# Patient Record
Sex: Female | Born: 1985 | Race: Black or African American | Hispanic: No | Marital: Single | Smoking: Never smoker
Health system: Southern US, Community
[De-identification: ages and names within clinical notes are randomized; demographics above are authoritative.]

---

## 2008-02-03 ENCOUNTER — Ambulatory Visit: Payer: Self-pay | Admitting: Family Medicine

## 2008-02-27 ENCOUNTER — Ambulatory Visit: Payer: Self-pay | Admitting: Family Medicine

## 2009-04-08 ENCOUNTER — Ambulatory Visit: Payer: Self-pay | Admitting: Internal Medicine

## 2009-08-25 ENCOUNTER — Ambulatory Visit: Payer: Self-pay | Admitting: Internal Medicine

## 2010-01-23 ENCOUNTER — Ambulatory Visit: Payer: Self-pay | Admitting: Family Medicine

## 2010-02-13 ENCOUNTER — Ambulatory Visit: Payer: Self-pay | Admitting: Surgery

## 2010-02-21 ENCOUNTER — Ambulatory Visit: Payer: Self-pay | Admitting: Surgery

## 2010-05-22 ENCOUNTER — Ambulatory Visit: Payer: Self-pay | Admitting: Family Medicine

## 2010-05-29 ENCOUNTER — Ambulatory Visit: Payer: Self-pay | Admitting: Family Medicine

## 2010-07-12 ENCOUNTER — Ambulatory Visit: Payer: Self-pay | Admitting: Family Medicine

## 2010-08-30 NOTE — Assessment & Plan Note (Signed)
Summary: FLU SHOT/EVM  Nurse Visit   Immunizations Administered:  Influenza Vaccine:    Vaccine Type: FLULAVAL    Site: right deltoid    Mfr: GlaxoSmithKline    Dose: 0.5 ml    Route: IM    Given by: Levonne Spiller EMT-P    Exp. Date: 01/26/2011    Lot #: [id number]    VIS given: 02/20/10 version given July 12, 2010.   Immunizations Administered:  Influenza Vaccine:    Vaccine Type: FLULAVAL    Site: right deltoid    Mfr: GlaxoSmithKline    Dose: 0.5 ml    Route: IM    Given by: Levonne Spiller EMT-P    Exp. Date: 01/26/2011    Lot #: [id number]    VIS given: 02/20/10 version given July 12, 2010.  Flu Vaccine Consent Questions:    Do you have a history of severe allergic reactions to this vaccine? no    Any prior history of allergic reactions to egg and/or gelatin? no    Do you have a sensitivity to the preservative Thimersol? no    Do you have a past history of Guillan-Barre Syndrome? no    Do you currently have an acute febrile illness? no    Have you ever had a severe reaction to latex? no    Vaccine information given and explained to patient? yes    Are you currently pregnant? no

## 2010-11-16 IMAGING — US ABDOMEN ULTRASOUND
1 series · 17 of 25 positions shown · non-contrast
Comparison: none

REASON FOR EXAM: RUQ AND EPIGASTRIC PAIN
COMMENTS:

[Series 1: abdomen ultrasound · 17 of 86 slices shown]
[im 1/86]
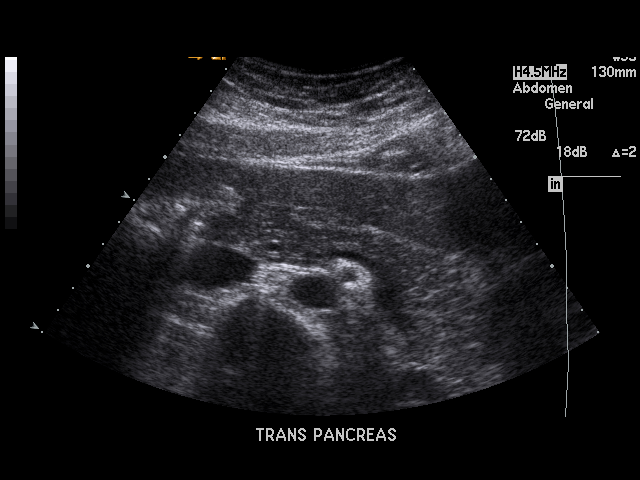
[im 8/86]
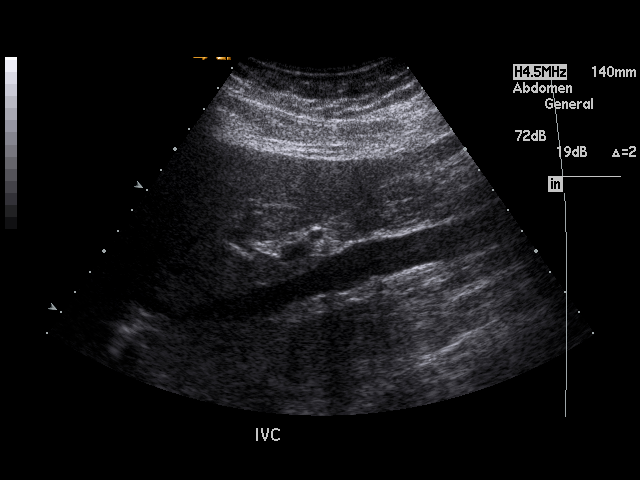
[im 11/86]
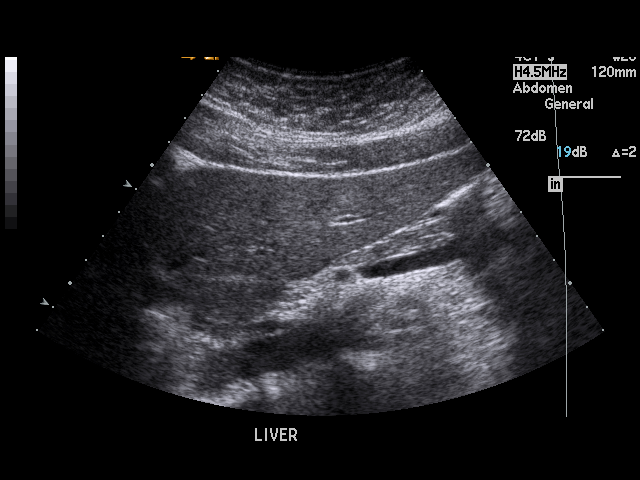
[im 18/86]
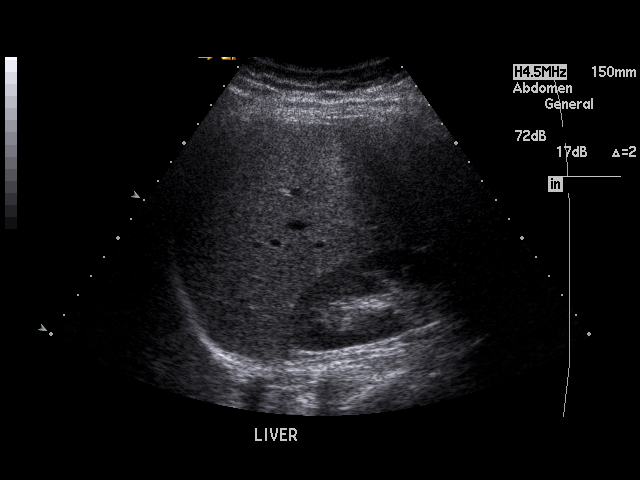
[im 22/86]
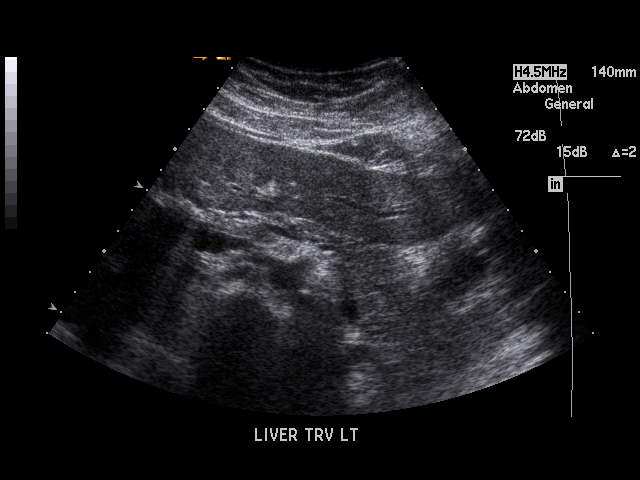
[im 29/86]
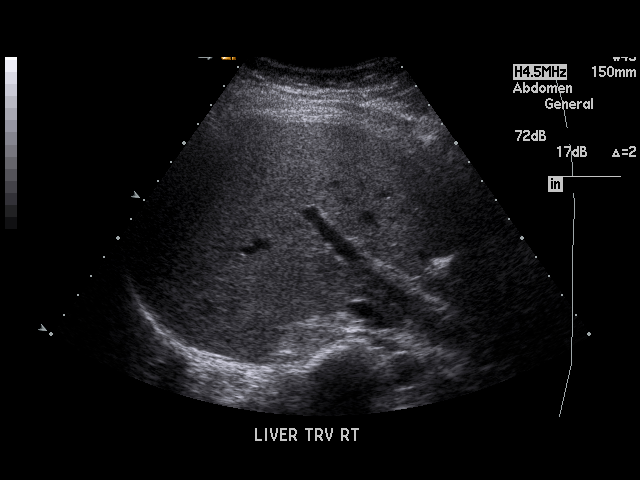
[im 32/86]
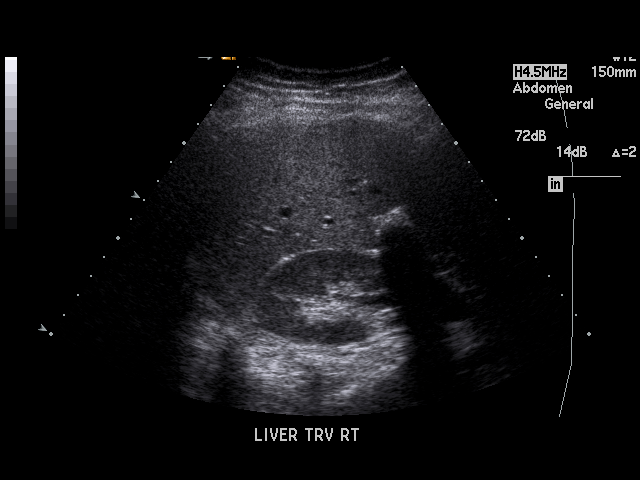
[im 39/86]
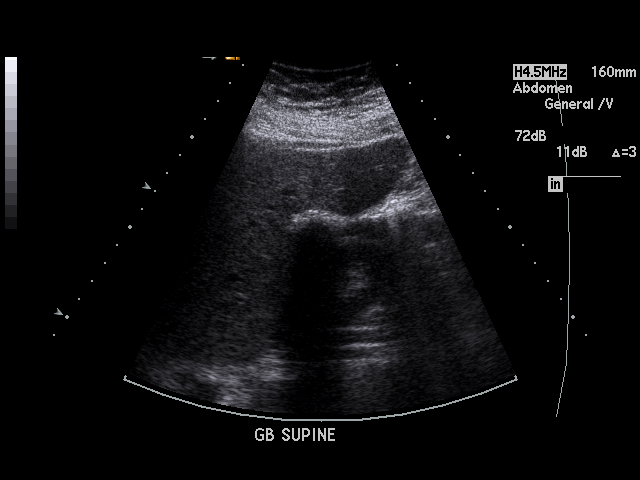
[im 43/86]
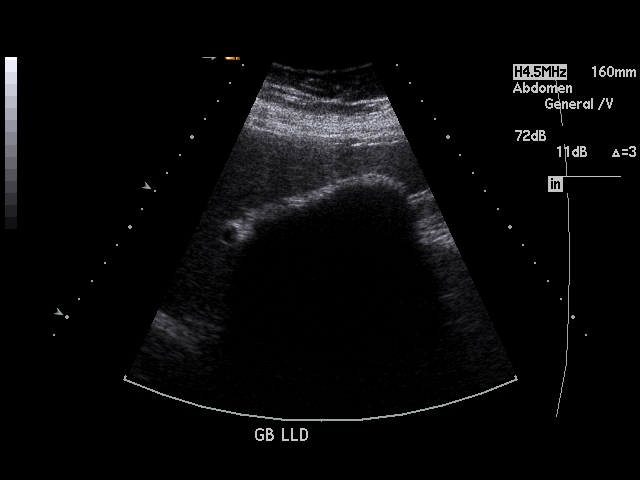
[im 47/86]
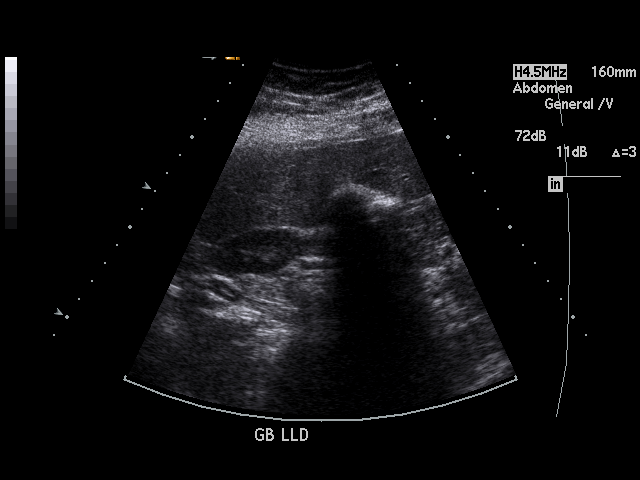
[im 54/86]
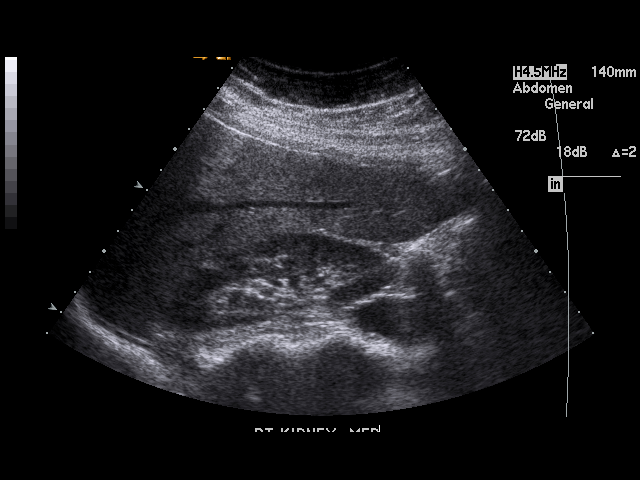
[im 57/86]
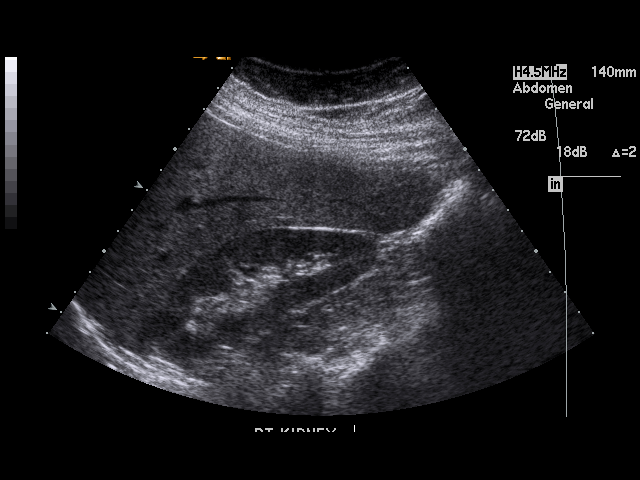
[im 64/86]
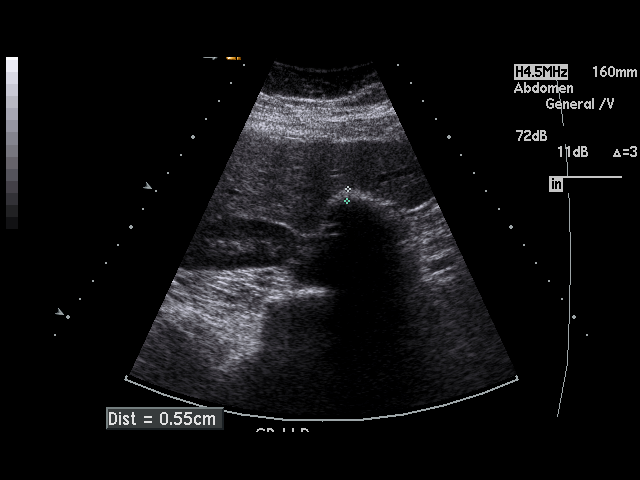
[im 68/86]
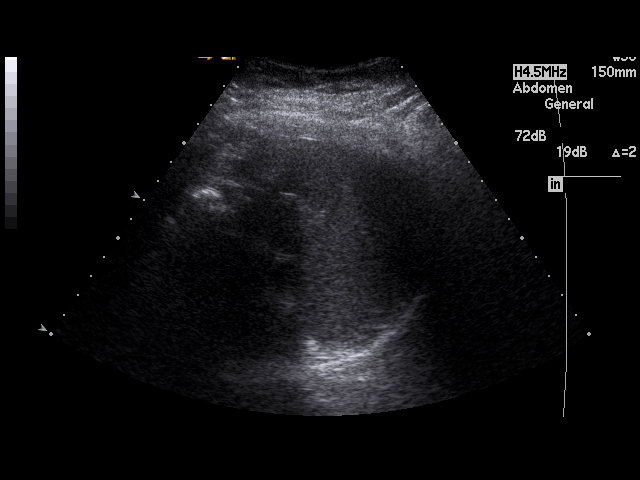
[im 75/86]
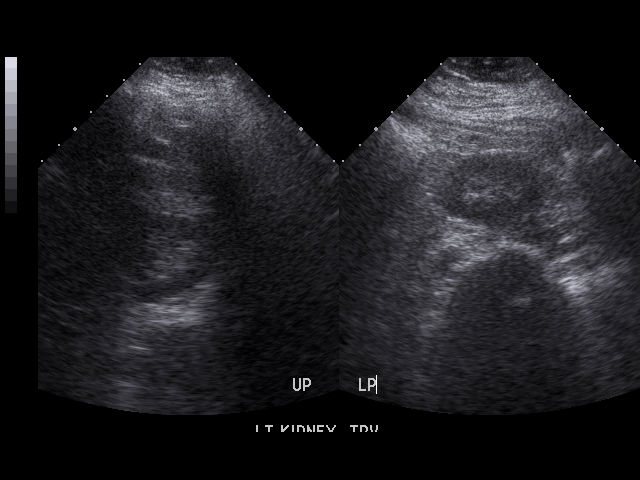
[im 78/86]
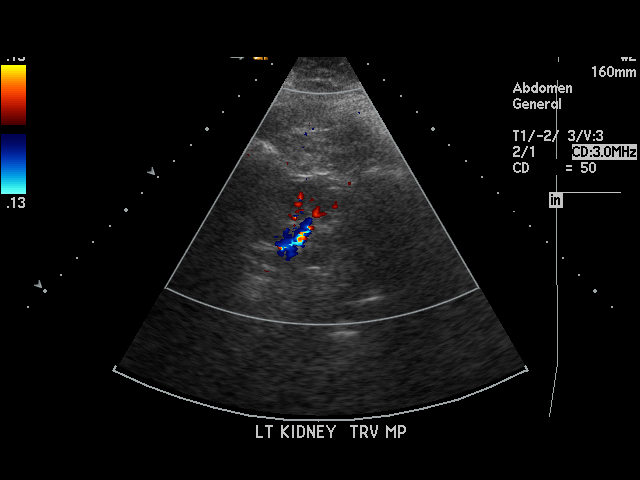
[im 86/86]
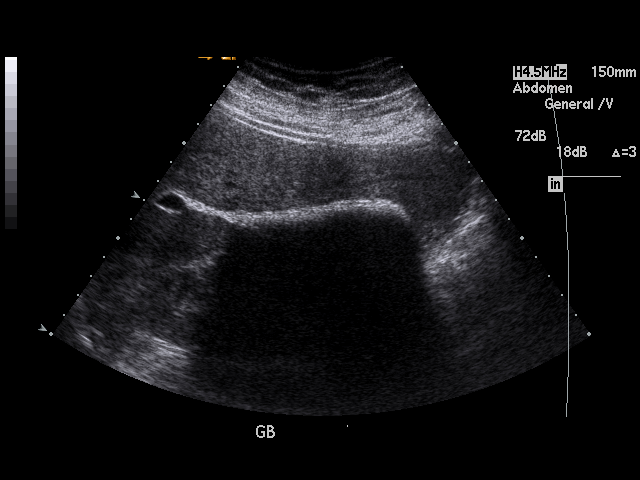

[17 of 25 positions shown; findings below may reference images not displayed]

PROCEDURE:     US  - US ABDOMEN GENERAL SURVEY  - January 23, 2010  [DATE]

RESULT:     The liver, spleen, pancreas, abdominal aorta and inferior vena
cava show no significant abnormalities. The gallbladder is not seen as a
cystic structure. There is a band of echo densities in the region of the
gallbladder bed compatible with stones in a contracted gallbladder. The
gallbladder wall is difficult to measure but appears thickened. The
gallbladder wall is estimated to measure 5.5 mm in thickness. The common
bile duct is normal in size. Kidneys show no hydronephrosis. There is no
ascites.
IMPRESSION: 1. Cholelithiasis.
2. The gallbladder is apparently contracted.
3. Possible thickening of the gallbladder wall.
4. The common bile duct is normal in size.

## 2021-02-17 ENCOUNTER — Other Ambulatory Visit: Payer: Self-pay

## 2021-02-17 ENCOUNTER — Emergency Department: Payer: Medicaid Other

## 2021-02-17 ENCOUNTER — Emergency Department
Admission: EM | Admit: 2021-02-17 | Discharge: 2021-02-17 | Disposition: A | Discharge status: Home / Self Care | Payer: Medicaid Other | Attending: Emergency Medicine | Admitting: Emergency Medicine

## 2021-02-17 ENCOUNTER — Encounter: Payer: Self-pay | Admitting: Emergency Medicine

## 2021-02-17 DIAGNOSIS — Z20822 Contact with and (suspected) exposure to covid-19: Secondary | ICD-10-CM

## 2021-02-17 DIAGNOSIS — R0602 Shortness of breath: Secondary | ICD-10-CM | POA: Insufficient documentation

## 2021-02-17 DIAGNOSIS — R0789 Other chest pain: Secondary | ICD-10-CM | POA: Insufficient documentation

## 2021-02-17 DIAGNOSIS — R079 Chest pain, unspecified: Secondary | ICD-10-CM

## 2021-02-17 LAB — RESP PANEL BY RT-PCR (FLU A&B, COVID) ARPGX2
Influenza A by PCR: NEGATIVE
Influenza B by PCR: NEGATIVE
SARS Coronavirus 2 by RT PCR: NEGATIVE

## 2021-02-17 LAB — BASIC METABOLIC PANEL
Anion gap: 6 (ref 5–15)
BUN: 10 mg/dL (ref 6–20)
CO2: 26 mmol/L (ref 22–32)
Calcium: 9 mg/dL (ref 8.9–10.3)
Chloride: 103 mmol/L (ref 98–111)
Creatinine, Ser: 0.86 mg/dL (ref 0.44–1.00)
GFR, Estimated: >60 mL/min (ref >60)
Glucose, Bld: 92 mg/dL (ref 70–99)
Potassium: 4.3 mmol/L (ref 3.5–5.1)
Sodium: 135 mmol/L (ref 135–145)

## 2021-02-17 LAB — CBC
HCT: 39.3 % (ref 36.0–46.0)
Hemoglobin: 12.7 g/dL (ref 12.0–15.0)
MCH: 30 pg (ref 26.0–34.0)
MCHC: 32.3 g/dL (ref 30.0–36.0)
MCV: 92.9 fL (ref 80.0–100.0)
Platelets: 305 10*3/uL (ref 150–400)
RBC: 4.23 MIL/uL (ref 3.87–5.11)
RDW: 14.4 % (ref 11.5–15.5)
WBC: 3.7 10*3/uL — ABNORMAL LOW (ref 4.0–10.5)
nRBC: 0 % (ref 0.0–0.2)

## 2021-02-17 LAB — TROPONIN I (HIGH SENSITIVITY): Troponin I (High Sensitivity): 13 ng/L (ref <18)

## 2021-02-17 NOTE — ED Provider Notes (Signed)
[facility] Emergency Department Provider Note  ____________________________________________   Event Date/Time   First MD Initiated Contact with Patient 02/17/21 1420     (approximate)  I have reviewed the triage vital signs and the nursing notes.   HISTORY  Chief Complaint Chest Pain   HPI Courtney Robertson is a 35 y.o. female without significant past medical history including any history of DVT, PE, CAD, tobacco abuse or estrogen supplementation or recent surgeries who presents for assessment of an episode of shortness of breath and some chest tightness associate with sensation of electricity in her arms.  She states this only lasted few minutes and she now has no pain or shortness of breath.  She denies any present episodes.  No cough, fevers, headache, sore throat she states she felt little bit tired and rundown last night.  She denies any vomiting, diarrhea, dysuria, rash or extremity pain.  No recent falls or injuries.  She denies tobacco use, illicit drug use or EtOH use.  No other acute concerns at this time.          History reviewed. No pertinent past medical history.  There are no problems to display for this patient.   History reviewed. No pertinent surgical history.  Prior to Admission medications   Not on File    Allergies Patient has no allergy information on record.  No family history on file.  Social History    Review of Systems  Review of Systems  Constitutional:  Negative for chills and fever.  HENT:  Negative for sore throat.   Eyes:  Negative for pain.  Respiratory:  Positive for shortness of breath. Negative for cough and stridor.   Cardiovascular:  Positive for chest pain.  Gastrointestinal:  Negative for vomiting.  Genitourinary:  Negative for dysuria.  Musculoskeletal:  Negative for myalgias.  Skin:  Negative for rash.  Neurological:  Negative for seizures, loss of consciousness and headaches.   Psychiatric/Behavioral:  Negative for suicidal ideas.   All other systems reviewed and are negative.    ____________________________________________   PHYSICAL EXAM:  VITAL SIGNS: ED Triage Vitals  Enc Vitals Group     BP 02/17/21 1127 125/85     Pulse Rate 02/17/21 1127 73     Resp 02/17/21 1127 16     Temp 02/17/21 1127 98.3 F (36.8 C)     Temp Source 02/17/21 1127 Oral     SpO2 02/17/21 1127 98 %     Weight --      Height --      Head Circumference --      Peak Flow --      Pain Score 02/17/21 1100 0     Pain Loc --      Pain Edu? --      Excl. in GC? --    Vitals:   02/17/21 1127  BP: 125/85  Pulse: 73  Resp: 16  Temp: 98.3 F (36.8 C)  SpO2: 98%   Physical Exam Vitals and nursing note reviewed.  Constitutional:      General: She is not in acute distress.    Appearance: Normal appearance. She is well-developed and normal weight.  HENT:     Head: Normocephalic and atraumatic.     Right Ear: External ear normal.     Left Ear: External ear normal.     Nose: Nose normal.     Mouth/Throat:     Mouth: Mucous membranes are moist.  Eyes:  Conjunctiva/sclera: Conjunctivae normal.  Cardiovascular:     Rate and Rhythm: Normal rate and regular rhythm.     Pulses: Normal pulses.     Heart sounds: No murmur heard. Pulmonary:     Effort: Pulmonary effort is normal. No respiratory distress.     Breath sounds: Normal breath sounds.  Abdominal:     Palpations: Abdomen is soft.     Tenderness: There is no abdominal tenderness.  Musculoskeletal:        General: No deformity or signs of injury.     Cervical back: Neck supple.  Skin:    General: Skin is warm and dry.     Capillary Refill: Capillary refill takes less than 2 seconds.  Neurological:     General: No focal deficit present.     Mental Status: She is alert and oriented to person, place, and time.  Psychiatric:        Mood and Affect: Mood normal.      ____________________________________________   LABS (all labs ordered are listed, but only abnormal results are displayed)  Labs Reviewed  CBC - Abnormal; Notable for the following components:      Result Value   WBC 3.7 (*)    All other components within normal limits  RESP PANEL BY RT-PCR (FLU A&B, COVID) ARPGX2  BASIC METABOLIC PANEL  POC URINE PREG, ED  TROPONIN I (HIGH SENSITIVITY)  TROPONIN I (HIGH SENSITIVITY)   ____________________________________________  EKG  Sinus rhythm with a ventricular rate of 67, normal axis, unremarkable intervals without evidence of acute ischemia or significant arrhythmia. ____________________________________________  RADIOLOGY  ED MD interpretation: Chest x-ray with no focal consolidation, large effusion, significant IMA, pneumothorax or any other clear acute intrathoracic process  Official radiology report(s): DG Chest 2 View  Result Date: 02/17/2021 CLINICAL DATA:  cp; chest pain and shortness of breath EXAM: CHEST - 2 VIEW COMPARISON:  None. FINDINGS: The cardiomediastinal silhouette is normal in contour. No pleural effusion. No pneumothorax. No acute pleuroparenchymal abnormality. Visualized abdomen is unremarkable. No acute osseous abnormality noted. IMPRESSION: No acute cardiopulmonary abnormality. Electronically Signed   By: Meda Klinefelter MD   On: 02/17/2021 11:30    ____________________________________________   PROCEDURES  Procedure(s) performed (including Critical Care):  Procedures   ____________________________________________   INITIAL IMPRESSION / ASSESSMENT AND PLAN / ED COURSE      Patient presents for assessment of symptoms of chest tightness and shortness of breath associate with some sensation of which was in her arms feeling down since last night.  On arrival she is afebrile and hemodynamically stable.  Differential includes ACS, PE, arrhythmia, anemia, electrolyte joints, pericarditis, myocarditis,  MSK possible pneumonia or bronchitis. Low suspicion for PE as patient is PERC negative.  ECG and nonelevated troponin are not suggestive of ACS or myocarditis.  EKG shows no arrhythmia.  BC shows WBC count of 3.7 without evidence of acute anemia.  BMP shows no significant electrolyte or metabolic derangements.  Certainly possible patient increasing some possible early proctitis or COVID symptoms despite denying any cough and has no fever.  Will swab for influenza and COVID.  Given she denies any symptoms at this time with otherwise reassuring exam and vitals I think she is stable for discharge with outpatient follow-up.  Discharged stable condition.  Strict return precautions advised and discussed.      ____________________________________________   FINAL CLINICAL IMPRESSION(S) / ED DIAGNOSES  Final diagnoses:  Chest pain, unspecified type  Person under investigation for COVID-19  Medications - No data to display   ED Discharge Orders     None        Note:  This document was prepared using Dragon voice recognition software and may include unintentional dictation errors.    Gilles Chiquito, MD 02/17/21 [phone]

## 2021-02-17 NOTE — ED Triage Notes (Signed)
Pt reports about an hour ago started with some pressure like cp that went across her chest and made her SOB. Pt reports pain is gone now.

## 2022-07-15 ENCOUNTER — Emergency Department
Admission: EM | Admit: 2022-07-15 | Discharge: 2022-07-15 | Disposition: A | Discharge status: Home / Self Care | Payer: Self-pay | Attending: Emergency Medicine | Admitting: Emergency Medicine

## 2022-07-15 ENCOUNTER — Other Ambulatory Visit: Payer: Self-pay

## 2022-07-15 DIAGNOSIS — U071 COVID-19: Secondary | ICD-10-CM | POA: Insufficient documentation

## 2022-07-15 DIAGNOSIS — J020 Streptococcal pharyngitis: Secondary | ICD-10-CM | POA: Insufficient documentation

## 2022-07-15 LAB — RESP PANEL BY RT-PCR (RSV, FLU A&B, COVID)  RVPGX2
Influenza A by PCR: NEGATIVE
Influenza B by PCR: NEGATIVE
Resp Syncytial Virus by PCR: NEGATIVE
SARS Coronavirus 2 by RT PCR: POSITIVE — AB

## 2022-07-15 LAB — GROUP A STREP BY PCR: Group A Strep by PCR: DETECTED — AB

## 2022-07-15 MED ORDER — DEXAMETHASONE 10 MG/ML FOR PEDIATRIC ORAL USE
10.0000 mg | Freq: Once | INTRAMUSCULAR | Status: AC
  Administered 2022-07-15: 10 mg via ORAL
  Filled 2022-07-15: qty 1

## 2022-07-15 MED ORDER — MAGIC MOUTHWASH
10.0000 mL | Freq: Once | ORAL | Status: AC
  Administered 2022-07-15: 10 mL via ORAL
  Filled 2022-07-15: qty 10

## 2022-07-15 MED ORDER — AMOXICILLIN 500 MG PO CAPS
500.0000 mg | ORAL_CAPSULE | Freq: Once | ORAL | Status: AC
  Administered 2022-07-15: 500 mg via ORAL
  Filled 2022-07-15: qty 1

## 2022-07-15 MED ORDER — AMOXICILLIN 500 MG PO CAPS: 500.0000 mg | ORAL_CAPSULE | Freq: Three times a day (TID) | ORAL | 0 refills | Status: DC

## 2022-07-15 MED ORDER — AMOXICILLIN-POT CLAVULANATE 875-125 MG PO TABS: 1.0000 | ORAL_TABLET | Freq: Once | ORAL | Status: DC

## 2022-07-15 MED ORDER — MAGIC MOUTHWASH: ORAL | 0 refills | Status: DC

## 2022-07-15 NOTE — ED Provider Notes (Signed)
[facility] Provider Note    Event Date/Time   First MD Initiated Contact with Patient 07/15/22 [phone]     (approximate)   History   Airway Obstruction (Feels like something stuck in throat.)   HPI  Courtney Robertson is a 36 y.o. female brought to the ED via EMS from home with a chief complaint of "airway obstruction".  Patient states she started to develop sore throat, cough, congestion and bodyaches 3 nights ago.  Feels like there is a piece of meat stuck in her throat.  Denies fever/chills, chest pain, shortness of breath, abdominal pain, nausea or vomiting.     Past Medical History  No past medical history on file.   Active Problem List  There are no problems to display for this patient.    Past Surgical History  No past surgical history on file.   Home Medications   Prior to Admission medications   Medication Sig Start Date End Date Taking? Authorizing Provider  amoxicillin (AMOXIL) 500 MG capsule Take 1 capsule (500 mg total) by mouth 3 (three) times daily. 07/15/22  Yes Irean Hong, MD  magic mouthwash SOLN 94mL Anbesol 53mL Benadryl 48mL Mylanta  27mL swish, gargle & spit q8hr prn throat discomfort 07/15/22  Yes Irean Hong, MD     Allergies  Patient has no known allergies.   Family History  No family history on file.   Physical Exam  Triage Vital Signs: ED Triage Vitals  Enc Vitals Group     BP 07/15/22 0146 (!) 164/107     Pulse Rate 07/15/22 0144 85     Resp 07/15/22 0144 16     Temp 07/15/22 0144 98.7 F (37.1 C)     Temp Source 07/15/22 0144 Oral     SpO2 07/15/22 0144 98 %     Weight 07/15/22 0141 275 lb (124.7 kg)     Height 07/15/22 0141 5\' 1"  (1.549 m)     Head Circumference --      Peak Flow --      Pain Score 07/15/22 0141 0     Pain Loc --      Pain Edu? --      Excl. in GC? --     Updated Vital Signs: BP (!) 164/107   Pulse 85   Temp 98.7 F (37.1 C) (Oral)   Resp 16   Ht 5\' 1"  (1.549 m)   Wt  124.7 kg   SpO2 98%   BMI 51.96 kg/m    General: Awake, no distress.  CV:  RRR.  Good peripheral perfusion.  Resp:  Normal effort.  CTAB. Abd:  Nontender.  No distention.  Other:  Moderately erythematous oropharynx with symmetrically enlarged tonsils with exudates.  No peritonsillar abscess.  There is no hoarse or muffled voice.  There is no drooling.  Tolerating secretions well.  Shotty anterior cervical lymphadenopathy.   ED Results / Procedures / Treatments  Labs (all labs ordered are listed, but only abnormal results are displayed) Labs Reviewed  RESP PANEL BY RT-PCR (RSV, FLU A&B, COVID)  RVPGX2 - Abnormal; Notable for the following components:      Result Value   SARS Coronavirus 2 by RT PCR POSITIVE (*)    All other components within normal limits  GROUP A STREP BY PCR - Abnormal; Notable for the following components:   Group A Strep by PCR DETECTED (*)    All other components within normal limits  EKG  None   RADIOLOGY None   Official radiology report(s): No results found.   PROCEDURES:  Critical Care performed: No  Procedures   MEDICATIONS ORDERED IN ED: Medications  magic mouthwash (10 mLs Oral Given 07/15/22 0327)  dexamethasone (DECADRON) 10 MG/ML injection for Pediatric ORAL use 10 mg (10 mg Oral Given 07/15/22 0324)  amoxicillin (AMOXIL) capsule 500 mg (500 mg Oral Given 07/15/22 0324)     IMPRESSION / MDM / ASSESSMENT AND PLAN / ED COURSE  I reviewed the triage vital signs and the nursing notes.                             36 year old female presenting with cold-like symptoms and sore throat.  She tested positive for both COVID-19 as well as group A strep.  She is not vaccinated against COVID-19.  After further discussion, patient opts against taking antiviral.  Will treat strep throat with antibiotics from [facility] $4 list per patient's request.  Give single dose Decadron now and Magic mouthwash. Strict return precautions given. Patient  verbalizes understanding and agrees with plan of care.  Patient's presentation is most consistent with acute, uncomplicated illness.   FINAL CLINICAL IMPRESSION(S) / ED DIAGNOSES   Final diagnoses:  Strep throat  COVID-19     Rx / DC Orders   ED Discharge Orders          Ordered    amoxicillin (AMOXIL) 500 MG capsule  3 times daily        07/15/22 0342    magic mouthwash SOLN        07/15/22 0342             Note:  This document was prepared using Dragon voice recognition software and may include unintentional dictation errors.   Paulette Blanch, MD 07/15/22 [phone]

## 2022-07-15 NOTE — ED Notes (Signed)
Pt BP is elevated. Assessed in both arms. Pt advised she has NOT taken her BP medications.

## 2022-07-15 NOTE — Discharge Instructions (Signed)
1.  Take antibiotic as prescribed (Amoxicillin 500 mg 3 times daily x7 days). °2.  You may use Magic mouthwash as needed for throat discomfort. °3.  Return to the ER for worsening symptoms, persistent vomiting, difficulty breathing or other concerns. °

## 2022-07-15 NOTE — ED Triage Notes (Signed)
Pt BIB [facility] from home for airway obstruction. Pt states "I feel like something is stuck in my throat". She was out of work Friday for same. Pt is CAOx4 and in no acute distress. Pt was awaken at midnight and felt this sensation in her throat.  EMS vitals: 197/116 99% on RA 105HR 98T

## 2022-07-15 NOTE — ED Notes (Signed)
NAD noted at time of D/C. Pt denies questions or concerns. Pt ambulatory to the lobby at this time. F/u care regarding pt's HTN discussed at this time, pt states has been non-compliant with her BP medications at this time.

## 2023-04-29 ENCOUNTER — Encounter: Payer: Self-pay | Admitting: Emergency Medicine

## 2023-04-29 ENCOUNTER — Emergency Department
Admission: EM | Admit: 2023-04-29 | Discharge: 2023-04-29 | Disposition: A | Discharge status: Home / Self Care | Payer: Medicaid Other | Attending: Emergency Medicine | Admitting: Emergency Medicine

## 2023-04-29 ENCOUNTER — Emergency Department: Payer: Medicaid Other

## 2023-04-29 ENCOUNTER — Other Ambulatory Visit: Payer: Self-pay

## 2023-04-29 DIAGNOSIS — R0789 Other chest pain: Secondary | ICD-10-CM | POA: Insufficient documentation

## 2023-04-29 LAB — BASIC METABOLIC PANEL
Anion gap: 7 (ref 5–15)
BUN: 11 mg/dL (ref 6–20)
CO2: 24 mmol/L (ref 22–32)
Calcium: 8.7 mg/dL — ABNORMAL LOW (ref 8.9–10.3)
Chloride: 105 mmol/L (ref 98–111)
Creatinine, Ser: 0.87 mg/dL (ref 0.44–1.00)
GFR, Estimated: >60 mL/min (ref >60)
Glucose, Bld: 104 mg/dL — ABNORMAL HIGH (ref 70–99)
Potassium: 4 mmol/L (ref 3.5–5.1)
Sodium: 136 mmol/L (ref 135–145)

## 2023-04-29 LAB — CBC
HCT: 43.1 % (ref 36.0–46.0)
Hemoglobin: 13.7 g/dL (ref 12.0–15.0)
MCH: 29.5 pg (ref 26.0–34.0)
MCHC: 31.8 g/dL (ref 30.0–36.0)
MCV: 92.9 fL (ref 80.0–100.0)
Platelets: 286 10*3/uL (ref 150–400)
RBC: 4.64 MIL/uL (ref 3.87–5.11)
RDW: 13.5 % (ref 11.5–15.5)
WBC: 3.7 10*3/uL — ABNORMAL LOW (ref 4.0–10.5)
nRBC: 0 % (ref 0.0–0.2)

## 2023-04-29 LAB — TROPONIN I (HIGH SENSITIVITY)
Troponin I (High Sensitivity): 2 ng/L (ref <18)
Troponin I (High Sensitivity): 3 ng/L (ref <18)

## 2023-04-29 LAB — POC URINE PREG, ED: Preg Test, Ur: NEGATIVE

## 2023-04-29 MED ORDER — IOHEXOL 350 MG/ML SOLN
75.0000 mL | Freq: Once | INTRAVENOUS | Status: AC | PRN
  Administered 2023-04-29: 100 mL via INTRAVENOUS

## 2023-04-29 NOTE — ED Notes (Signed)
See triage note  States she woke with some chest pressure   Felt like someone is sitting on chest  States pain is non radiating  States she did take her b/p med this am  b/c of chest pain

## 2023-04-29 NOTE — ED Triage Notes (Signed)
PT here with cp that started this morning. PT states she felt like something was sitting on her chest. Pt states pain is left sided and does not radiate. Pt called ems who evaluated her and stated she was stable but to still come to the ED d/t her elevated bp. Pt denies NVD.

## 2023-04-29 NOTE — ED Provider Notes (Signed)
[facility] Provider Note    Event Date/Time   First MD Initiated Contact with Patient 04/29/23 [phone]     (approximate)   History   Chest Pain   HPI  Courtney Robertson is a 37 y.o. female with no significant past medical history besides high blood pressure presents with complaints of chest discomfort.  Patient reports this developed around 645 after getting up at 6:30 AM.  She describes a pressure-like sensation, now improved.  No history of ACS, did recently drive back from [facility], not on any hormones, no history of DVT     Physical Exam   Triage Vital Signs: ED Triage Vitals  Encounter Vitals Group     BP 04/29/23 0925 (!) 159/102     Systolic BP Percentile --      Diastolic BP Percentile --      Pulse Rate 04/29/23 0925 75     Resp 04/29/23 0925 18     Temp 04/29/23 0925 98.4 F (36.9 C)     Temp Source 04/29/23 0925 Oral     SpO2 04/29/23 0925 100 %     Weight 04/29/23 0921 124.7 kg (274 lb 14.6 oz)     Height 04/29/23 0921 1.549 m (5\' 1" )     Head Circumference --      Peak Flow --      Pain Score 04/29/23 0921 3     Pain Loc --      Pain Education --      Exclude from Growth Chart --     Most recent vital signs: Vitals:   04/29/23 1135 04/29/23 1416  BP: (!) 137/101 (!) 130/98  Pulse: 69 70  Resp: 19 18  Temp:    SpO2: 100% 100%     General: Awake, no distress.  CV:  Good peripheral perfusion.  Resp:  Normal effort.  Clear to auscultation bilaterally Abd:  No distention.  Other:  No calf pain or swelling   ED Results / Procedures / Treatments   Labs (all labs ordered are listed, but only abnormal results are displayed) Labs Reviewed  BASIC METABOLIC PANEL - Abnormal; Notable for the following components:      Result Value   Glucose, Bld 104 (*)    Calcium 8.7 (*)    All other components within normal limits  CBC - Abnormal; Notable for the following components:   WBC 3.7 (*)    All other components  within normal limits  POC URINE PREG, ED  TROPONIN I (HIGH SENSITIVITY)  TROPONIN I (HIGH SENSITIVITY)     EKG  ED ECG REPORT I, Jene Every, the attending physician, personally viewed and interpreted this ECG.  Date: 04/29/2023  Rhythm: normal sinus rhythm QRS Axis: normal Intervals: normal ST/T Wave abnormalities: normal Narrative Interpretation: no evidence of acute ischemia    RADIOLOGY Chest x-ray viewed interpret by me, no acute abnormality    PROCEDURES:  Critical Care performed:   Procedures   MEDICATIONS ORDERED IN ED: Medications  iohexol (OMNIPAQUE) 350 MG/ML injection 75 mL (100 mLs Intravenous Contrast Given 04/29/23 1148)     IMPRESSION / MDM / ASSESSMENT AND PLAN / ED COURSE  I reviewed the triage vital signs and the nursing notes. Patient's presentation is most consistent with acute presentation with potential threat to life or bodily function.  Patient presents with chest discomfort as detailed above.  Differential includes ACS, myocarditis, PE, GERD, chest wall pain  EKG is reassuring, high sensitive troponin  is unremarkable, not consistent with ACS.  Given pleurisy, recent long drive will send for CT angiography  CT scan is negative for PE, second troponin is normal, she is asymptomatic at this time  Prefer discharge at this time with close outpatient follow-up as needed, return precautions discussed.        FINAL CLINICAL IMPRESSION(S) / ED DIAGNOSES   Final diagnoses:  Atypical chest pain     Rx / DC Orders   ED Discharge Orders     None        Note:  This document was prepared using Dragon voice recognition software and may include unintentional dictation errors.   Jene Every, MD 04/29/23 1520

## 2023-04-29 NOTE — ED Notes (Signed)
See triage note  Presents with some chest pain  Describes as someone sitting on chest
